# Patient Record
Sex: Female | Born: 1985 | Race: Black or African American | Hispanic: No | Marital: Married | Smoking: Current every day smoker
Health system: Southern US, Community
[De-identification: ages and names within clinical notes are randomized; demographics above are authoritative.]

## PROBLEM LIST (undated history)

## (undated) DIAGNOSIS — N809 Endometriosis, unspecified: Secondary | ICD-10-CM

## (undated) DIAGNOSIS — J45909 Unspecified asthma, uncomplicated: Secondary | ICD-10-CM

## (undated) DIAGNOSIS — D219 Benign neoplasm of connective and other soft tissue, unspecified: Secondary | ICD-10-CM

## (undated) DIAGNOSIS — D649 Anemia, unspecified: Secondary | ICD-10-CM

## (undated) DIAGNOSIS — E282 Polycystic ovarian syndrome: Secondary | ICD-10-CM

---

## 2015-02-25 ENCOUNTER — Encounter (HOSPITAL_COMMUNITY): Payer: Self-pay | Admitting: Emergency Medicine

## 2015-02-25 ENCOUNTER — Emergency Department (HOSPITAL_COMMUNITY)
Admission: EM | Admit: 2015-02-25 | Discharge: 2015-02-25 | Discharge status: Against Medical Advice | Payer: Self-pay | Attending: Emergency Medicine | Admitting: Emergency Medicine

## 2015-02-25 DIAGNOSIS — R1030 Lower abdominal pain, unspecified: Secondary | ICD-10-CM | POA: Insufficient documentation

## 2015-02-25 LAB — CBC
HEMATOCRIT: 37.4 % (ref 36.0–46.0)
HEMOGLOBIN: 12.2 g/dL (ref 12.0–15.0)
MCH: 27.7 pg (ref 26.0–34.0)
MCHC: 32.6 g/dL (ref 30.0–36.0)
MCV: 85 fL (ref 78.0–100.0)
Platelets: 328 10*3/uL (ref 150–400)
RBC: 4.4 MIL/uL (ref 3.87–5.11)
RDW: 14 % (ref 11.5–15.5)
WBC: 8.2 10*3/uL (ref 4.0–10.5)

## 2015-02-25 LAB — COMPREHENSIVE METABOLIC PANEL
ALBUMIN: 4 g/dL (ref 3.5–5.0)
ALT: 9 U/L — ABNORMAL LOW (ref 14–54)
ANION GAP: 4 — AB (ref 5–15)
AST: 16 U/L (ref 15–41)
Alkaline Phosphatase: 58 U/L (ref 38–126)
BILIRUBIN TOTAL: 0.3 mg/dL (ref 0.3–1.2)
BUN: 17 mg/dL (ref 6–20)
CO2: 30 mmol/L (ref 22–32)
Calcium: 9.3 mg/dL (ref 8.9–10.3)
Chloride: 106 mmol/L (ref 101–111)
Creatinine, Ser: 0.86 mg/dL (ref 0.44–1.00)
GLUCOSE: 93 mg/dL (ref 65–99)
POTASSIUM: 3.9 mmol/L (ref 3.5–5.1)
Sodium: 140 mmol/L (ref 135–145)
TOTAL PROTEIN: 7.6 g/dL (ref 6.5–8.1)

## 2015-02-25 LAB — HCG, QUANTITATIVE, PREGNANCY: hCG, Beta Chain, Quant, S: <1 m[IU]/mL (ref <5)

## 2015-02-25 LAB — LIPASE, BLOOD: Lipase: 33 U/L (ref 22–51)

## 2015-02-25 NOTE — ED Notes (Signed)
Pt arrived to the ED with a complaint of lower abdominal pain.  Pt states she has had this pain previously and it was stated that she had endometriosis.  Pt states pain began an hour and a half ago. Pt states pain is a stabbing pulling sensation.

## 2015-07-24 ENCOUNTER — Emergency Department (HOSPITAL_COMMUNITY): Payer: Self-pay

## 2015-07-24 ENCOUNTER — Emergency Department (HOSPITAL_COMMUNITY)
Admission: EM | Admit: 2015-07-24 | Discharge: 2015-07-24 | Disposition: A | Discharge status: Home / Self Care | Payer: Self-pay | Attending: Emergency Medicine | Admitting: Emergency Medicine

## 2015-07-24 ENCOUNTER — Encounter (HOSPITAL_COMMUNITY): Payer: Self-pay | Admitting: Emergency Medicine

## 2015-07-24 DIAGNOSIS — N760 Acute vaginitis: Secondary | ICD-10-CM | POA: Insufficient documentation

## 2015-07-24 DIAGNOSIS — Z880 Allergy status to penicillin: Secondary | ICD-10-CM | POA: Insufficient documentation

## 2015-07-24 DIAGNOSIS — J40 Bronchitis, not specified as acute or chronic: Secondary | ICD-10-CM

## 2015-07-24 DIAGNOSIS — Z862 Personal history of diseases of the blood and blood-forming organs and certain disorders involving the immune mechanism: Secondary | ICD-10-CM | POA: Insufficient documentation

## 2015-07-24 DIAGNOSIS — J45901 Unspecified asthma with (acute) exacerbation: Secondary | ICD-10-CM | POA: Insufficient documentation

## 2015-07-24 DIAGNOSIS — B9689 Other specified bacterial agents as the cause of diseases classified elsewhere: Secondary | ICD-10-CM

## 2015-07-24 DIAGNOSIS — R102 Pelvic and perineal pain: Secondary | ICD-10-CM

## 2015-07-24 DIAGNOSIS — J209 Acute bronchitis, unspecified: Secondary | ICD-10-CM | POA: Insufficient documentation

## 2015-07-24 DIAGNOSIS — D259 Leiomyoma of uterus, unspecified: Secondary | ICD-10-CM | POA: Insufficient documentation

## 2015-07-24 DIAGNOSIS — F17200 Nicotine dependence, unspecified, uncomplicated: Secondary | ICD-10-CM | POA: Insufficient documentation

## 2015-07-24 DIAGNOSIS — Z3202 Encounter for pregnancy test, result negative: Secondary | ICD-10-CM | POA: Insufficient documentation

## 2015-07-24 HISTORY — DX: Endometriosis, unspecified: N80.9

## 2015-07-24 HISTORY — DX: Anemia, unspecified: D64.9

## 2015-07-24 HISTORY — DX: Benign neoplasm of connective and other soft tissue, unspecified: D21.9

## 2015-07-24 HISTORY — DX: Unspecified asthma, uncomplicated: J45.909

## 2015-07-24 LAB — URINALYSIS, ROUTINE W REFLEX MICROSCOPIC
Bilirubin Urine: NEGATIVE
GLUCOSE, UA: NEGATIVE mg/dL
HGB URINE DIPSTICK: NEGATIVE
Ketones, ur: NEGATIVE mg/dL
LEUKOCYTES UA: NEGATIVE
Nitrite: NEGATIVE
Protein, ur: NEGATIVE mg/dL
SPECIFIC GRAVITY, URINE: 1.019 (ref 1.005–1.030)
pH: 6.5 (ref 5.0–8.0)

## 2015-07-24 LAB — COMPREHENSIVE METABOLIC PANEL
ALBUMIN: 4.1 g/dL (ref 3.5–5.0)
ALT: 9 U/L — ABNORMAL LOW (ref 14–54)
AST: 17 U/L (ref 15–41)
Alkaline Phosphatase: 52 U/L (ref 38–126)
Anion gap: 10 (ref 5–15)
BUN: 10 mg/dL (ref 6–20)
CHLORIDE: 109 mmol/L (ref 101–111)
CO2: 22 mmol/L (ref 22–32)
Calcium: 9.1 mg/dL (ref 8.9–10.3)
Creatinine, Ser: 0.82 mg/dL (ref 0.44–1.00)
GFR calc Af Amer: >60 mL/min (ref >60)
GFR calc non Af Amer: >60 mL/min (ref >60)
GLUCOSE: 82 mg/dL (ref 65–99)
POTASSIUM: 3.6 mmol/L (ref 3.5–5.1)
SODIUM: 141 mmol/L (ref 135–145)
Total Bilirubin: 0.4 mg/dL (ref 0.3–1.2)
Total Protein: 7.6 g/dL (ref 6.5–8.1)

## 2015-07-24 LAB — CBC WITH DIFFERENTIAL/PLATELET
BASOS ABS: 0 10*3/uL (ref 0.0–0.1)
BASOS PCT: 0 %
EOS ABS: 0.4 10*3/uL (ref 0.0–0.7)
Eosinophils Relative: 4 %
HEMATOCRIT: 35.7 % — AB (ref 36.0–46.0)
Hemoglobin: 11.5 g/dL — ABNORMAL LOW (ref 12.0–15.0)
Lymphocytes Relative: 24 %
Lymphs Abs: 2.3 10*3/uL (ref 0.7–4.0)
MCH: 27.1 pg (ref 26.0–34.0)
MCHC: 32.2 g/dL (ref 30.0–36.0)
MCV: 84.2 fL (ref 78.0–100.0)
MONO ABS: 0.7 10*3/uL (ref 0.1–1.0)
Monocytes Relative: 7 %
NEUTROS ABS: 6.2 10*3/uL (ref 1.7–7.7)
Neutrophils Relative %: 65 %
PLATELETS: 309 10*3/uL (ref 150–400)
RBC: 4.24 MIL/uL (ref 3.87–5.11)
RDW: 14 % (ref 11.5–15.5)
WBC: 9.6 10*3/uL (ref 4.0–10.5)

## 2015-07-24 LAB — I-STAT BETA HCG BLOOD, ED (MC, WL, AP ONLY): I-stat hCG, quantitative: <5 m[IU]/mL (ref <5)

## 2015-07-24 LAB — WET PREP, GENITAL
SPERM: NONE SEEN
TRICH WET PREP: NONE SEEN
YEAST WET PREP: NONE SEEN

## 2015-07-24 MED ORDER — KETOROLAC TROMETHAMINE 30 MG/ML IJ SOLN
30.0000 mg | Freq: Once | INTRAMUSCULAR | Status: AC
  Administered 2015-07-24: 30 mg via INTRAVENOUS
  Filled 2015-07-24: qty 1

## 2015-07-24 MED ORDER — DEXAMETHASONE SODIUM PHOSPHATE 10 MG/ML IJ SOLN
10.0000 mg | Freq: Once | INTRAMUSCULAR | Status: AC
  Administered 2015-07-24: 10 mg via INTRAVENOUS
  Filled 2015-07-24: qty 1

## 2015-07-24 MED ORDER — DEXTROSE 5 % IV SOLN
1.0000 g | Freq: Once | INTRAVENOUS | Status: AC
  Administered 2015-07-24: 1 g via INTRAVENOUS
  Filled 2015-07-24: qty 10

## 2015-07-24 MED ORDER — ALBUTEROL SULFATE (2.5 MG/3ML) 0.083% IN NEBU
5.0000 mg | INHALATION_SOLUTION | Freq: Once | RESPIRATORY_TRACT | Status: AC
  Administered 2015-07-24: 5 mg via RESPIRATORY_TRACT
  Filled 2015-07-24: qty 6

## 2015-07-24 MED ORDER — METRONIDAZOLE 500 MG PO TABS: 500.0000 mg | ORAL_TABLET | Freq: Two times a day (BID) | ORAL | Status: AC

## 2015-07-24 MED ORDER — MORPHINE SULFATE (PF) 2 MG/ML IV SOLN
2.0000 mg | Freq: Once | INTRAVENOUS | Status: AC
Start: 2015-07-24 — End: 2015-07-24
  Administered 2015-07-24: 2 mg via INTRAVENOUS
  Filled 2015-07-24: qty 1

## 2015-07-24 MED ORDER — DEXAMETHASONE 2 MG PO TABS: 10.0000 mg | ORAL_TABLET | Freq: Once | ORAL | Status: AC

## 2015-07-24 MED ORDER — IPRATROPIUM BROMIDE 0.02 % IN SOLN
0.5000 mg | Freq: Once | RESPIRATORY_TRACT | Status: AC
  Administered 2015-07-24: 0.5 mg via RESPIRATORY_TRACT
  Filled 2015-07-24: qty 2.5

## 2015-07-24 MED ORDER — IBUPROFEN 600 MG PO TABS: 600.0000 mg | ORAL_TABLET | Freq: Four times a day (QID) | ORAL | Status: AC | PRN

## 2015-07-24 MED ORDER — IPRATROPIUM-ALBUTEROL 0.5-2.5 (3) MG/3ML IN SOLN
3.0000 mL | Freq: Once | RESPIRATORY_TRACT | Status: AC
  Administered 2015-07-24: 3 mL via RESPIRATORY_TRACT
  Filled 2015-07-24: qty 3

## 2015-07-24 MED ORDER — ALBUTEROL SULFATE HFA 108 (90 BASE) MCG/ACT IN AERS
1.0000 | INHALATION_SPRAY | RESPIRATORY_TRACT | Status: DC | PRN
  Administered 2015-07-24: 2 via RESPIRATORY_TRACT
  Filled 2015-07-24: qty 6.7

## 2015-07-24 MED ORDER — METRONIDAZOLE 500 MG PO TABS
500.0000 mg | ORAL_TABLET | Freq: Once | ORAL | Status: AC
  Administered 2015-07-24: 500 mg via ORAL
  Filled 2015-07-24: qty 1

## 2015-07-24 MED ORDER — AZITHROMYCIN 250 MG PO TABS
1000.0000 mg | ORAL_TABLET | Freq: Once | ORAL | Status: AC
  Administered 2015-07-24: 1000 mg via ORAL
  Filled 2015-07-24: qty 4

## 2015-07-24 NOTE — ED Notes (Signed)
U/S AT THE BEDSIDE.

## 2015-07-24 NOTE — ED Notes (Signed)
MD at bedside. 

## 2015-07-24 NOTE — Discharge Instructions (Signed)
You were seen today for your pelvic pain and shortness of breath with coughing.  Your pelvic pain is likely secondary to a bacterial infection in your vagina and a fibroid in your uterus.  Take the medications as prescribed to cure the infection and to help control your pain.  Follow up at the women's center for continued treatment and management.  Use the inhaler to help with your breathing.  Take the second dose of steroids as prescribed.  Follow up at the wellness clinic to establish care and for continued management of what may be recurrence of your asthma or bronchitis.  It is important that you stop smoking to help allow your lungs to heal.  Acute Bronchitis Bronchitis is inflammation of the airways that extend from the windpipe into the lungs (bronchi). The inflammation often causes mucus to develop. This leads to a cough, which is the most common symptom of bronchitis.  In acute bronchitis, the condition usually develops suddenly and goes away over time, usually in a couple weeks. Smoking, allergies, and asthma can make bronchitis worse. Repeated episodes of bronchitis may cause further lung problems.  CAUSES Acute bronchitis is most often caused by the same virus that causes a cold. The virus can spread from person to person (contagious) through coughing, sneezing, and touching contaminated objects. SIGNS AND SYMPTOMS   Cough.   Fever.   Coughing up mucus.   Body aches.   Chest congestion.   Chills.   Shortness of breath.   Sore throat.  DIAGNOSIS  Acute bronchitis is usually diagnosed through a physical exam. Your health care provider will also ask you questions about your medical history. Tests, such as chest X-rays, are sometimes done to rule out other conditions.  TREATMENT  Acute bronchitis usually goes away in a couple weeks. Oftentimes, no medical treatment is necessary. Medicines are sometimes given for relief of fever or cough. Antibiotic medicines are usually not  needed but may be prescribed in certain situations. In some cases, an inhaler may be recommended to help reduce shortness of breath and control the cough. A cool mist vaporizer may also be used to help thin bronchial secretions and make it easier to clear the chest.  HOME CARE INSTRUCTIONS  Get plenty of rest.   Drink enough fluids to keep your urine clear or pale yellow (unless you have a medical condition that requires fluid restriction). Increasing fluids may help thin your respiratory secretions (sputum) and reduce chest congestion, and it will prevent dehydration.   Take medicines only as directed by your health care provider.  If you were prescribed an antibiotic medicine, finish it all even if you start to feel better.  Avoid smoking and secondhand smoke. Exposure to cigarette smoke or irritating chemicals will make bronchitis worse. If you are a smoker, consider using nicotine gum or skin patches to help control withdrawal symptoms. Quitting smoking will help your lungs heal faster.   Reduce the chances of another bout of acute bronchitis by washing your hands frequently, avoiding people with cold symptoms, and trying not to touch your hands to your mouth, nose, or eyes.   Keep all follow-up visits as directed by your health care provider.  SEEK MEDICAL CARE IF: Your symptoms do not improve after 1 week of treatment.  SEEK IMMEDIATE MEDICAL CARE IF:  You develop an increased fever or chills.   You have chest pain.   You have severe shortness of breath.  You have bloody sputum.   You develop dehydration.  You faint or repeatedly feel like you are going to pass out.  You develop repeated vomiting.  You develop a severe headache. MAKE SURE YOU:   Understand these instructions.  Will watch your condition.  Will get help right away if you are not doing well or get worse.   This information is not intended to replace advice given to you by your health care  provider. Make sure you discuss any questions you have with your health care provider.   Document Released: 08/05/2004 Document Revised: 07/19/2014 Document Reviewed: 12/19/2012 Elsevier Interactive Patient Education 2016 Elsevier Inc.  Bacterial Vaginosis Bacterial vaginosis is a vaginal infection that occurs when the normal balance of bacteria in the vagina is disrupted. It results from an overgrowth of certain bacteria. This is the most common vaginal infection in women of childbearing age. Treatment is important to prevent complications, especially in pregnant women, as it can cause a premature delivery. CAUSES  Bacterial vaginosis is caused by an increase in harmful bacteria that are normally present in smaller amounts in the vagina. Several different kinds of bacteria can cause bacterial vaginosis. However, the reason that the condition develops is not fully understood. RISK FACTORS Certain activities or behaviors can put you at an increased risk of developing bacterial vaginosis, including:  Having a new sex partner or multiple sex partners.  Douching.  Using an intrauterine device (IUD) for contraception. Women do not get bacterial vaginosis from toilet seats, bedding, swimming pools, or contact with objects around them. SIGNS AND SYMPTOMS  Some women with bacterial vaginosis have no signs or symptoms. Common symptoms include:  Grey vaginal discharge.  A fishlike odor with discharge, especially after sexual intercourse.  Itching or burning of the vagina and vulva.  Burning or pain with urination. DIAGNOSIS  Your health care provider will take a medical history and examine the vagina for signs of bacterial vaginosis. A sample of vaginal fluid may be taken. Your health care provider will look at this sample under a microscope to check for bacteria and abnormal cells. A vaginal pH test may also be done.  TREATMENT  Bacterial vaginosis may be treated with antibiotic medicines.  These may be given in the form of a pill or a vaginal cream. A second round of antibiotics may be prescribed if the condition comes back after treatment. Because bacterial vaginosis increases your risk for sexually transmitted diseases, getting treated can help reduce your risk for chlamydia, gonorrhea, HIV, and herpes. HOME CARE INSTRUCTIONS   Only take over-the-counter or prescription medicines as directed by your health care provider.  If antibiotic medicine was prescribed, take it as directed. Make sure you finish it even if you start to feel better.  Tell all sexual partners that you have a vaginal infection. They should see their health care provider and be treated if they have problems, such as a mild rash or itching.  During treatment, it is important that you follow these instructions:  Avoid sexual activity or use condoms correctly.  Do not douche.  Avoid alcohol as directed by your health care provider.  Avoid breastfeeding as directed by your health care provider. SEEK MEDICAL CARE IF:   Your symptoms are not improving after 3 days of treatment.  You have increased discharge or pain.  You have a fever. MAKE SURE YOU:   Understand these instructions.  Will watch your condition.  Will get help right away if you are not doing well or get worse. FOR MORE INFORMATION  Centers for  Disease Control and Prevention, Division of STD Prevention: [employer] American Sexual Health Association (ASHA): www.ashastd.org    This information is not intended to replace advice given to you by your health care provider. Make sure you discuss any questions you have with your health care provider.   Document Released: 06/28/2005 Document Revised: 07/19/2014 Document Reviewed: 02/07/2013 Elsevier Interactive Patient Education 2016 Elsevier Inc.  Uterine Fibroids Uterine fibroids are tissue masses (tumors) that can develop in the womb (uterus). They are also called leiomyomas. This  type of tumor is not cancerous (benign) and does not spread to other parts of the body outside of the pelvic area, which is between the hip bones. Occasionally, fibroids may develop in the fallopian tubes, in the cervix, or on the support structures (ligaments) that surround the uterus. You can have one or many fibroids. Fibroids can vary in size, weight, and where they grow in the uterus. Some can become quite large. Most fibroids do not require medical treatment. CAUSES A fibroid can develop when a single uterine cell keeps growing (replicating). Most cells in the human body have a control mechanism that keeps them from replicating without control. SIGNS AND SYMPTOMS Symptoms may include:   Heavy bleeding during your period.  Bleeding or spotting between periods.  Pelvic pain and pressure.  Bladder problems, such as needing to urinate more often (urinary frequency) or urgently.  Inability to reproduce offspring (infertility).  Miscarriages. DIAGNOSIS Uterine fibroids are diagnosed through a physical exam. Your health care provider may feel the lumpy tumors during a pelvic exam. Ultrasonography and an MRI may be done to determine the size, location, and number of fibroids. TREATMENT Treatment may include:  Watchful waiting. This involves getting the fibroid checked by your health care provider to see if it grows or shrinks. Follow your health care provider's recommendations for how often to have this checked.  Hormone medicines. These can be taken by mouth or given through an intrauterine device (IUD).  Surgery.  Removing the fibroids (myomectomy) or the uterus (hysterectomy).  Removing blood supply to the fibroids (uterine artery embolization). If fibroids interfere with your fertility and you want to become pregnant, your health care provider may recommend having the fibroids removed.  HOME CARE INSTRUCTIONS  Keep all follow-up visits as directed by your health care provider.  This is important.  Take medicines only as directed by your health care provider.  If you were prescribed a hormone treatment, take the hormone medicines exactly as directed.  Do not take aspirin, because it can cause bleeding.  Ask your health care provider about taking iron pills and increasing the amount of dark green, leafy vegetables in your diet. These actions can help to boost your blood iron levels, which may be affected by heavy menstrual bleeding.  Pay close attention to your period and tell your health care provider about any changes, such as:  Increased blood flow that requires you to use more pads or tampons than usual per month.  A change in the number of days that your period lasts per month.  A change in symptoms that are associated with your period, such as abdominal cramping or back pain. SEEK MEDICAL CARE IF:  You have pelvic pain, back pain, or abdominal cramps that cannot be controlled with medicines.  You have an increase in bleeding between and during periods.  You soak tampons or pads in a half hour or less.  You feel lightheaded, extra tired, or weak. SEEK IMMEDIATE MEDICAL CARE IF:  You faint.  You have a sudden increase in pelvic pain.   This information is not intended to replace advice given to you by your health care provider. Make sure you discuss any questions you have with your health care provider.   Document Released: 06/25/2000 Document Revised: 07/19/2014 Document Reviewed: 12/25/2013 Elsevier Interactive Patient Education [employer].

## 2015-07-24 NOTE — ED Notes (Signed)
Pt states she has had a cough and chest congestion with tightness x 4 days  Pt states she took dayquil the first day and then yesterday took some ibuprofen and sudafed once  Pt states she has chronic pelvic pain for the past 3 years but has been worse for the past week  Pt states she has history of ovarian cysts, endometriosis, and fibroids

## 2015-07-24 NOTE — ED Provider Notes (Signed)
CSN: [id number]     Arrival date & time 07/24/15  [id number] History   First MD Initiated Contact with Patient 07/24/15 1606     Chief Complaint  Patient presents with  . Cough  . Pelvic Pain     (Consider location/radiation/quality/duration/timing/severity/associated sxs/prior Treatment) HPI Comments: 30 y.o. Female with history of asthma as a child/teenager, ovarian cysts, fibroids, endometriosis presents for pelvic pain as well as cough and shortness of breath.  The patient recently moved up here to live with her sister after a divorce.  She was previously living in [place].  She reports the stress of the situation has made her start smoking again and that she has noted wheezing and coughing over the last several days.  She says it is worse at night and that she wakes up feeling like she is unable to breathe.  Denies fever or chills.  No known sick contacts.  Says this feels like her asthma but that she has not required treatment since she was about 30 years old.  She also reports that she has had worsening pelvic pain from her chronic pain and that the pain is mostly on the left side.  She says that she was once told her ovary may have twisted on itself by an OBGYN in [place] but that they ended up checking it with imaging and that it was not twisted.  Denies vaginal discharge or bleeding.  She reports that the pain in her left pelvis is at times severe to the point where she feels she might pass out.  Patient is a 30 y.o. female presenting with cough and pelvic pain.  Cough Associated symptoms: shortness of breath and wheezing   Associated symptoms: no chest pain, no chills, no fever, no headaches, no myalgias, no rash, no rhinorrhea and no sore throat   Pelvic Pain Associated symptoms include shortness of breath. Pertinent negatives include no chest pain, no abdominal pain and no headaches.    Past Medical History  Diagnosis Date  . Asthma   . Anemia   . Endometriosis   . Fibroids    Past  Surgical History  Procedure Laterality Date  . Cesarean section  2013  . Cesarean section     Family History  Problem Relation Age of Onset  . Diabetes Other   . CAD Other    Social History  Substance Use Topics  . Smoking status: Current Every Day Smoker  . Smokeless tobacco: None  . Alcohol Use: Yes   OB History    No data available     Review of Systems  Constitutional: Negative for fever, chills and fatigue.  HENT: Negative for congestion, postnasal drip, rhinorrhea, sinus pressure, sore throat and voice change.   Eyes: Negative for visual disturbance.  Respiratory: Positive for cough, shortness of breath and wheezing. Negative for chest tightness.   Cardiovascular: Negative for chest pain, palpitations and leg swelling.  Gastrointestinal: Negative for nausea, vomiting, abdominal pain, diarrhea and constipation.  Genitourinary: Positive for pelvic pain. Negative for dysuria, urgency, flank pain and vaginal pain.  Musculoskeletal: Negative for myalgias and back pain.  Skin: Negative for rash.  Neurological: Negative for dizziness, weakness and headaches.  Hematological: Does not bruise/bleed easily.      Allergies  Penicillins and Tylenol  Home Medications   Prior to Admission medications   Medication Sig Start Date End Date Taking? Authorizing Provider  pseudoephedrine (SUDAFED) 60 MG tablet Take 60 mg by mouth every 4 (four) hours as needed for  congestion.   Yes Historical Provider, MD  dexamethasone (DECADRON) 2 MG tablet Take 5 tablets (10 mg total) by mouth once. Take as a single dose on 07/25/14 in the morning after breakfast 07/24/15   Harvel Quale, MD  ibuprofen (ADVIL,MOTRIN) 600 MG tablet Take 1 tablet (600 mg total) by mouth every 6 (six) hours as needed. 07/24/15   Harvel Quale, MD  metroNIDAZOLE (FLAGYL) 500 MG tablet Take 1 tablet (500 mg total) by mouth 2 (two) times daily. 07/24/15   Harvel Quale, MD   BP 120/72 mmHg  Pulse 94  Temp(Src) 98  F (36.7 C) (Oral)  Resp 18  Ht 4\' 11"  (1.499 m)  Wt 135 lb (61.236 kg)  BMI 27.25 kg/m2  SpO2 100%  LMP 05/26/2015 (Exact Date) Physical Exam  Constitutional: She is oriented to person, place, and time. She appears well-developed and well-nourished. No distress.  HENT:  Head: Normocephalic and atraumatic.  Right Ear: External ear normal.  Left Ear: External ear normal.  Nose: Nose normal.  Mouth/Throat: Oropharynx is clear and moist. No oropharyngeal exudate.  Eyes: EOM are normal. Pupils are equal, round, and reactive to light.  Neck: Normal range of motion. Neck supple.  Cardiovascular: Normal rate, regular rhythm, normal heart sounds and intact distal pulses.   No murmur heard. Pulmonary/Chest: Effort normal. No respiratory distress. She has wheezes (bilateral, diffuse). She has no rales.  Abdominal: Soft. She exhibits no distension. There is tenderness (tenderness in the left pelvis) in the left lower quadrant. There is no rebound and no CVA tenderness.  Musculoskeletal: Normal range of motion. She exhibits no edema or tenderness.  Neurological: She is alert and oriented to person, place, and time.  Skin: Skin is warm and dry. No rash noted. She is not diaphoretic.  Vitals reviewed.   ED Course  Procedures (including critical care time) Labs Review Labs Reviewed  WET PREP, GENITAL - Abnormal; Notable for the following:    Clue Cells Wet Prep HPF POC PRESENT (*)    WBC, Wet Prep HPF POC MODERATE (*)    All other components within normal limits  CBC WITH DIFFERENTIAL/PLATELET - Abnormal; Notable for the following:    Hemoglobin 11.5 (*)    HCT 35.7 (*)    All other components within normal limits  COMPREHENSIVE METABOLIC PANEL - Abnormal; Notable for the following:    ALT 9 (*)    All other components within normal limits  URINALYSIS, ROUTINE W REFLEX MICROSCOPIC (NOT AT [facility]) - Abnormal; Notable for the following:    APPearance CLOUDY (*)    All other components  within normal limits  RPR  HIV ANTIBODY (ROUTINE TESTING)  I-STAT BETA HCG BLOOD, ED ([facility], [facility], [facility] ONLY)  GC/CHLAMYDIA PROBE AMP ([place]) NOT AT [facility]    Imaging Review Dg Chest 2 View  07/24/2015  CLINICAL DATA:  Cough and congestion for 4 days EXAM: CHEST - 2 VIEW COMPARISON:  None. FINDINGS: The heart size and mediastinal contours are within normal limits. Both lungs are clear. The visualized skeletal structures are unremarkable. IMPRESSION: No active disease. Electronically Signed   By: Inez Catalina M.D.   On: 07/24/2015 16:42   US Transvaginal Non-ob  07/24/2015  CLINICAL DATA:  Acute bilateral pelvic pain. EXAM: TRANSABDOMINAL AND TRANSVAGINAL ULTRASOUND OF PELVIS TECHNIQUE: Both transabdominal and transvaginal ultrasound examinations of the pelvis were performed. Transabdominal technique was performed for global imaging of the pelvis including uterus, ovaries, adnexal regions, and pelvic cul-de-sac. It was  necessary to proceed with endovaginal exam following the transabdominal exam to visualize the ovaries. COMPARISON:  None FINDINGS: Uterus Measurements: 8.7 x 7.8 x 6.4 cm. Fibroid measuring 4.8 x 5.5 x 5.2 cm is noted on right side. Endometrium Thickness: 8 mm which is within normal limits. No focal abnormality visualized. Right ovary Measurements: 4.2 x 3.1 x 2.3 cm. Multiple follicular cysts are noted, with the largest measuring 2.1 cm. Left ovary Measurements: 4.1 x 3.1 x 1.7 cm. Normal appearance/no adnexal mass. Other findings No abnormal free fluid. IMPRESSION: 5.5 cm uterine fibroid is noted. No other significant abnormality seen in the pelvis. Electronically Signed   By: Marijo Conception, M.D.   On: 07/24/2015 19:04   US Pelvis Complete  07/24/2015  CLINICAL DATA:  Acute bilateral pelvic pain. EXAM: TRANSABDOMINAL AND TRANSVAGINAL ULTRASOUND OF PELVIS TECHNIQUE: Both transabdominal and transvaginal ultrasound examinations of the pelvis were performed. Transabdominal technique was  performed for global imaging of the pelvis including uterus, ovaries, adnexal regions, and pelvic cul-de-sac. It was necessary to proceed with endovaginal exam following the transabdominal exam to visualize the ovaries. COMPARISON:  None FINDINGS: Uterus Measurements: 8.7 x 7.8 x 6.4 cm. Fibroid measuring 4.8 x 5.5 x 5.2 cm is noted on right side. Endometrium Thickness: 8 mm which is within normal limits. No focal abnormality visualized. Right ovary Measurements: 4.2 x 3.1 x 2.3 cm. Multiple follicular cysts are noted, with the largest measuring 2.1 cm. Left ovary Measurements: 4.1 x 3.1 x 1.7 cm. Normal appearance/no adnexal mass. Other findings No abnormal free fluid. IMPRESSION: 5.5 cm uterine fibroid is noted. No other significant abnormality seen in the pelvis. Electronically Signed   By: Marijo Conception, M.D.   On: 07/24/2015 19:04   I have personally reviewed and evaluated these images and lab results as part of my medical decision-making.   EKG Interpretation None      MDM  Patient was seen and evaluated in stable condition.  Patient was given Decadron and breathing treatment for her wheezing with improvement.  Chest xray negative for acute process.  Pelvic examination with mild discharge.  Wet prep with clue cells consistent with BV.  Pelvic US with fibroid uterus as well as multiple follicular cysts on the right kidney which is not the side of her pain - no sign of torsion.  Patient was treated empirically for STD with Rocephin and Azithromycin.  She was also discharged with prescription for flagyl.  She was also provided an albuterol inhaler and a prescription for a second dose of decadron.  She was instructed to follow up at the women's center and with the wellness clinic for establishment of care and reevaluation.  Patient was counseled on the need to quit smoking. Final diagnoses:  Bacterial vaginosis  Bronchitis  Uterine leiomyoma, unspecified location    1. Bacterial vaginosis  2.  Bronchitis  3. Uterine fibroid    Harvel Quale, MD 07/25/15 [phone]

## 2015-07-25 LAB — GC/CHLAMYDIA PROBE AMP (~~LOC~~) NOT AT ARMC
CHLAMYDIA, DNA PROBE: NEGATIVE
NEISSERIA GONORRHEA: NEGATIVE

## 2015-07-25 LAB — RPR: RPR Ser Ql: NONREACTIVE

## 2015-07-25 LAB — HIV ANTIBODY (ROUTINE TESTING W REFLEX): HIV SCREEN 4TH GENERATION: NONREACTIVE

## 2015-11-22 ENCOUNTER — Encounter (HOSPITAL_COMMUNITY): Payer: Self-pay | Admitting: Emergency Medicine

## 2015-11-22 ENCOUNTER — Telehealth (HOSPITAL_BASED_OUTPATIENT_CLINIC_OR_DEPARTMENT_OTHER): Payer: Self-pay

## 2015-11-22 ENCOUNTER — Emergency Department (HOSPITAL_COMMUNITY): Payer: Medicaid Other

## 2015-11-22 ENCOUNTER — Emergency Department (HOSPITAL_COMMUNITY)
Admission: EM | Admit: 2015-11-22 | Discharge: 2015-11-22 | Disposition: A | Discharge status: Home / Self Care | Payer: Medicaid Other | Attending: Emergency Medicine | Admitting: Emergency Medicine

## 2015-11-22 DIAGNOSIS — Z7952 Long term (current) use of systemic steroids: Secondary | ICD-10-CM | POA: Insufficient documentation

## 2015-11-22 DIAGNOSIS — Z79899 Other long term (current) drug therapy: Secondary | ICD-10-CM | POA: Diagnosis not present

## 2015-11-22 DIAGNOSIS — Z331 Pregnant state, incidental: Secondary | ICD-10-CM

## 2015-11-22 DIAGNOSIS — F17200 Nicotine dependence, unspecified, uncomplicated: Secondary | ICD-10-CM | POA: Diagnosis not present

## 2015-11-22 DIAGNOSIS — Z3490 Encounter for supervision of normal pregnancy, unspecified, unspecified trimester: Secondary | ICD-10-CM

## 2015-11-22 DIAGNOSIS — Z3A11 11 weeks gestation of pregnancy: Secondary | ICD-10-CM | POA: Diagnosis not present

## 2015-11-22 DIAGNOSIS — O3411 Maternal care for benign tumor of corpus uteri, first trimester: Secondary | ICD-10-CM | POA: Insufficient documentation

## 2015-11-22 DIAGNOSIS — D259 Leiomyoma of uterus, unspecified: Secondary | ICD-10-CM | POA: Insufficient documentation

## 2015-11-22 DIAGNOSIS — O26891 Other specified pregnancy related conditions, first trimester: Secondary | ICD-10-CM | POA: Diagnosis present

## 2015-11-22 DIAGNOSIS — O99331 Smoking (tobacco) complicating pregnancy, first trimester: Secondary | ICD-10-CM | POA: Diagnosis not present

## 2015-11-22 DIAGNOSIS — Z792 Long term (current) use of antibiotics: Secondary | ICD-10-CM | POA: Insufficient documentation

## 2015-11-22 DIAGNOSIS — Z791 Long term (current) use of non-steroidal anti-inflammatories (NSAID): Secondary | ICD-10-CM | POA: Diagnosis not present

## 2015-11-22 DIAGNOSIS — J45909 Unspecified asthma, uncomplicated: Secondary | ICD-10-CM | POA: Diagnosis not present

## 2015-11-22 HISTORY — DX: Polycystic ovarian syndrome: E28.2

## 2015-11-22 LAB — URINALYSIS, ROUTINE W REFLEX MICROSCOPIC
BILIRUBIN URINE: NEGATIVE
Glucose, UA: NEGATIVE mg/dL
Hgb urine dipstick: NEGATIVE
Ketones, ur: >80 mg/dL — AB
Leukocytes, UA: NEGATIVE
NITRITE: NEGATIVE
Protein, ur: NEGATIVE mg/dL
SPECIFIC GRAVITY, URINE: 1.03 (ref 1.005–1.030)
pH: 6 (ref 5.0–8.0)

## 2015-11-22 LAB — COMPREHENSIVE METABOLIC PANEL
ALT: 12 U/L — ABNORMAL LOW (ref 14–54)
ANION GAP: 9 (ref 5–15)
AST: 17 U/L (ref 15–41)
Albumin: 4 g/dL (ref 3.5–5.0)
Alkaline Phosphatase: 32 U/L — ABNORMAL LOW (ref 38–126)
BUN: 10 mg/dL (ref 6–20)
CHLORIDE: 107 mmol/L (ref 101–111)
CO2: 21 mmol/L — ABNORMAL LOW (ref 22–32)
Calcium: 9.5 mg/dL (ref 8.9–10.3)
Creatinine, Ser: 0.58 mg/dL (ref 0.44–1.00)
Glucose, Bld: 73 mg/dL (ref 65–99)
POTASSIUM: 4 mmol/L (ref 3.5–5.1)
Sodium: 137 mmol/L (ref 135–145)
Total Bilirubin: 0.5 mg/dL (ref 0.3–1.2)
Total Protein: 7.3 g/dL (ref 6.5–8.1)

## 2015-11-22 LAB — CBC WITH DIFFERENTIAL/PLATELET
BASOS ABS: 0 10*3/uL (ref 0.0–0.1)
Basophils Relative: 0 %
EOS PCT: 1 %
Eosinophils Absolute: 0 10*3/uL (ref 0.0–0.7)
HCT: 35.5 % — ABNORMAL LOW (ref 36.0–46.0)
Hemoglobin: 12 g/dL (ref 12.0–15.0)
LYMPHS PCT: 23 %
Lymphs Abs: 2 10*3/uL (ref 0.7–4.0)
MCH: 28.5 pg (ref 26.0–34.0)
MCHC: 33.8 g/dL (ref 30.0–36.0)
MCV: 84.3 fL (ref 78.0–100.0)
Monocytes Absolute: 0.4 10*3/uL (ref 0.1–1.0)
Monocytes Relative: 5 %
Neutro Abs: 6 10*3/uL (ref 1.7–7.7)
Neutrophils Relative %: 71 %
Platelets: 287 10*3/uL (ref 150–400)
RBC: 4.21 MIL/uL (ref 3.87–5.11)
RDW: 15.1 % (ref 11.5–15.5)
WBC: 8.4 10*3/uL (ref 4.0–10.5)

## 2015-11-22 LAB — HCG, QUANTITATIVE, PREGNANCY: HCG, BETA CHAIN, QUANT, S: 123515 m[IU]/mL — AB (ref <5)

## 2015-11-22 NOTE — ED Notes (Signed)
Pt reports lower abd pain in vaginal area for past 2 days accompanied by dizziness and weakness. Pt is [redacted] weeks pregnant and having spotting.  Also has had emesis everyday for the past month.

## 2015-11-22 NOTE — ED Notes (Signed)
Pt requesting to speak to EDP before discharge. Pt given sandwich and and juice. EDP Zammit made aware

## 2015-11-22 NOTE — ED Notes (Signed)
Patient transported to Ultrasound 

## 2015-11-22 NOTE — Discharge Instructions (Signed)
Follow-up at the [facility] women's clinic this week take Tylenol for pain

## 2015-11-22 NOTE — ED Provider Notes (Signed)
CSN: [id number]     Arrival date & time 11/22/15  1037 History   First MD Initiated Contact with Patient 11/22/15 1130     Chief Complaint  Patient presents with  . Abdominal Pain     (Consider location/radiation/quality/duration/timing/severity/associated sxs/prior Treatment) Patient is a 30 y.o. female presenting with abdominal pain. The history is provided by the patient (Patient states she is [redacted] weeks pregnant she's been having some lower abdominal discomfort with little bit of spotting. She states she has a fibroid.).  Abdominal Pain Pain location:  Suprapubic Pain quality: aching   Pain radiates to:  Does not radiate Pain severity:  Mild Onset quality:  Sudden Timing:  Intermittent Progression:  Partially resolved Chronicity:  Recurrent Context: alcohol use   Associated symptoms: no chest pain, no cough, no diarrhea, no fatigue and no hematuria     Past Medical History  Diagnosis Date  . Asthma   . Anemia   . Endometriosis   . Fibroids   . PCOS (polycystic ovarian syndrome)    Past Surgical History  Procedure Laterality Date  . Cesarean section  2013  . Cesarean section     Family History  Problem Relation Age of Onset  . Diabetes Other   . CAD Other    Social History  Substance Use Topics  . Smoking status: Current Every Day Smoker  . Smokeless tobacco: None  . Alcohol Use: Yes   OB History    No data available     Review of Systems  Constitutional: Negative for appetite change and fatigue.  HENT: Negative for congestion, ear discharge and sinus pressure.   Eyes: Negative for discharge.  Respiratory: Negative for cough.   Cardiovascular: Negative for chest pain.  Gastrointestinal: Positive for abdominal pain. Negative for diarrhea.  Genitourinary: Negative for frequency and hematuria.  Musculoskeletal: Negative for back pain.  Skin: Negative for rash.  Neurological: Negative for seizures and headaches.  Psychiatric/Behavioral: Negative for  hallucinations.      Allergies  Penicillins and Tylenol  Home Medications   Prior to Admission medications   Medication Sig Start Date End Date Taking? Authorizing Provider  Prenatal Vit-Fe Fumarate-FA (PRENATAL MULTIVITAMIN) TABS tablet Take 1 tablet by mouth daily at 12 noon.   Yes Historical Provider, MD  dexamethasone (DECADRON) 2 MG tablet Take 5 tablets (10 mg total) by mouth once. Take as a single dose on 07/25/14 in the morning after breakfast Patient not taking: Reported on 11/22/2015 07/24/15   Harvel Quale, MD  ibuprofen (ADVIL,MOTRIN) 600 MG tablet Take 1 tablet (600 mg total) by mouth every 6 (six) hours as needed. Patient not taking: Reported on 11/22/2015 07/24/15   Harvel Quale, MD  metroNIDAZOLE (FLAGYL) 500 MG tablet Take 1 tablet (500 mg total) by mouth 2 (two) times daily. Patient not taking: Reported on 11/22/2015 07/24/15   Harvel Quale, MD   BP 105/63 mmHg  Pulse 59  Temp(Src) 98.7 F (37.1 C) (Oral)  Resp 18  Ht 4\' 11"  (1.499 m)  Wt 134 lb (60.782 kg)  BMI 27.05 kg/m2  SpO2 100% Physical Exam  Constitutional: She is oriented to person, place, and time. She appears well-developed.  HENT:  Head: Normocephalic.  Eyes: Conjunctivae and EOM are normal. No scleral icterus.  Neck: Neck supple. No thyromegaly present.  Cardiovascular: Normal rate and regular rhythm.  Exam reveals no gallop and no friction rub.   No murmur heard. Pulmonary/Chest: No stridor. She has no wheezes. She has no rales.  She exhibits no tenderness.  Abdominal: She exhibits no distension. There is tenderness. There is no rebound.  Patient's abdomen is consistent with a [redacted] week pregnant woman. Patient have been minor tenderness over her uterus.  Musculoskeletal: Normal range of motion. She exhibits no edema.  Lymphadenopathy:    She has no cervical adenopathy.  Neurological: She is oriented to person, place, and time. She exhibits normal muscle tone. Coordination normal.  Skin: No  rash noted. No erythema.  Psychiatric: She has a normal mood and affect. Her behavior is normal.    ED Course  Procedures (including critical care time) Labs Review Labs Reviewed  CBC WITH DIFFERENTIAL/PLATELET - Abnormal; Notable for the following:    HCT 35.5 (*)    All other components within normal limits  COMPREHENSIVE METABOLIC PANEL - Abnormal; Notable for the following:    CO2 21 (*)    ALT 12 (*)    Alkaline Phosphatase 32 (*)    All other components within normal limits  URINALYSIS, ROUTINE W REFLEX MICROSCOPIC (NOT AT [facility]) - Abnormal; Notable for the following:    Ketones, ur >80 (*)    All other components within normal limits  HCG, QUANTITATIVE, PREGNANCY - Abnormal; Notable for the following:    hCG, Beta Chain, [place], [place] [id number] (*)    All other components within normal limits    Imaging Review US Ob Comp Less 14 Wks  11/22/2015  CLINICAL DATA:  Abdominal pain. EXAM: OBSTETRIC <14 WK [place] AND TRANSVAGINAL OB US TECHNIQUE: Both transabdominal and transvaginal ultrasound examinations were performed for complete evaluation of the gestation as well as the maternal uterus, adnexal regions, and pelvic cul-de-sac. Transvaginal technique was performed to assess early pregnancy. COMPARISON:  None. FINDINGS: An intrauterine pregnancy is identified. The fetal heart rate is 175 beats per minute. The crown-rump length is 4.3 cm correlating with a gestational age of [redacted] weeks and 2 days. A large fibroid is seen at the fundus of the uterus measuring 1.4 x 8.9 x 1.3 cm. No fluid in the cul-de-sac. No subchorionic hemorrhage identified. Both ovaries are somewhat enlarged with the right measuring 5.5 x 1.9 x 2.7 cm and the left measuring 3.9 x 1.9 x 5.1 cm. Both ovaries contain multiple small follicles. A rounded collection is seen adjacent to the lumbar region of the fetus. This is probably the yolk sac. Recommend attention on follow-up. No other abnormalities are identified. IMPRESSION: No acute  abnormalities identified. Single live IUP as described above. Electronically Signed   By: Dorise Bullion III M.D   On: 11/22/2015 14:11   US Ob Transvaginal  11/22/2015  CLINICAL DATA:  Abdominal pain. EXAM: OBSTETRIC <14 WK [place] AND TRANSVAGINAL OB US TECHNIQUE: Both transabdominal and transvaginal ultrasound examinations were performed for complete evaluation of the gestation as well as the maternal uterus, adnexal regions, and pelvic cul-de-sac. Transvaginal technique was performed to assess early pregnancy. COMPARISON:  None. FINDINGS: An intrauterine pregnancy is identified. The fetal heart rate is 175 beats per minute. The crown-rump length is 4.3 cm correlating with a gestational age of [redacted] weeks and 2 days. A large fibroid is seen at the fundus of the uterus measuring 1.4 x 8.9 x 1.3 cm. No fluid in the cul-de-sac. No subchorionic hemorrhage identified. Both ovaries are somewhat enlarged with the right measuring 5.5 x 1.9 x 2.7 cm and the left measuring 3.9 x 1.9 x 5.1 cm. Both ovaries contain multiple small follicles. A rounded collection is seen adjacent to the lumbar  region of the fetus. This is probably the yolk sac. Recommend attention on follow-up. No other abnormalities are identified. IMPRESSION: No acute abnormalities identified. Single live IUP as described above. Electronically Signed   By: Dorise Bullion III M.D   On: 11/22/2015 14:11   I have personally reviewed and evaluated these images and lab results as part of my medical decision-making.   EKG Interpretation None      MDM   Final diagnoses:  IUP (intrauterine pregnancy), incidental  Uterine leiomyoma, unspecified location    Ultrasound shows healthy 11 weeks fetus with 9 cm fibroid. Patient told to take Tylenol for pain and follow-up with the [facility]    Milton Ferguson, MD 11/22/15 [phone]

## 2015-11-22 NOTE — ED Notes (Signed)
MD at bedside. 

## 2017-04-30 IMAGING — US US PELVIS COMPLETE
1 series · 14 of 25 positions shown · non-contrast
Comparison: None

CLINICAL DATA: Acute bilateral pelvic pain.



[Series 1: us pelvis complete · 0.22mm/px · 14 of 113 slices shown]
[im 1/113]
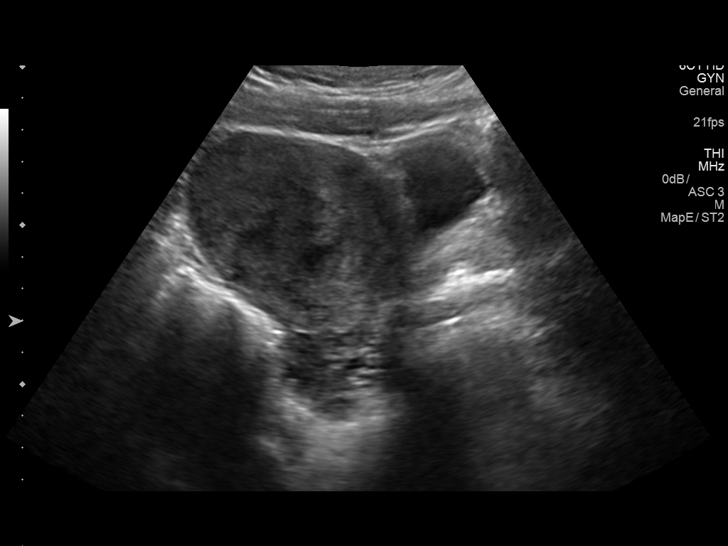
[im 10/113]
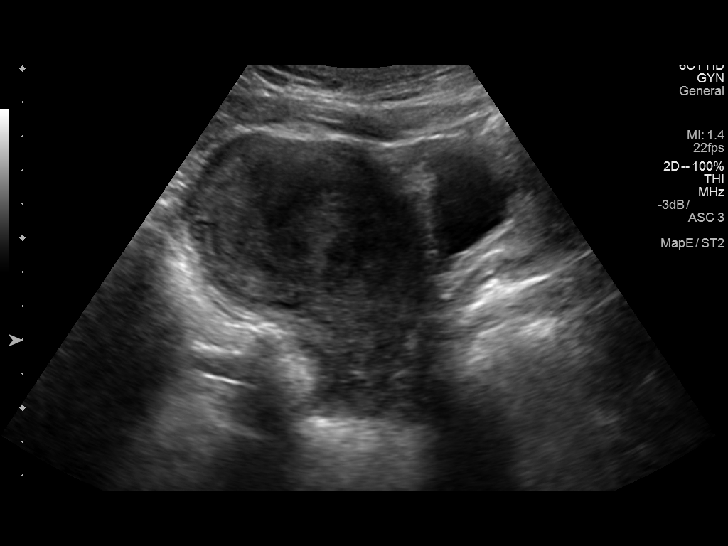
[im 19/113]
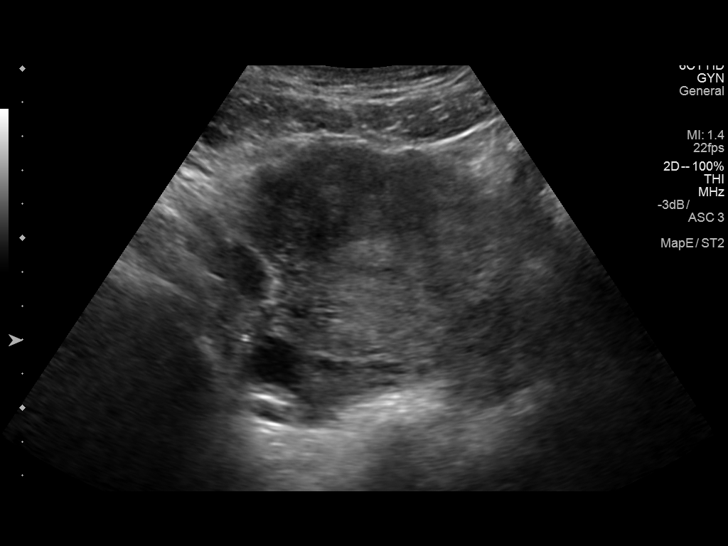
[im 29/113]
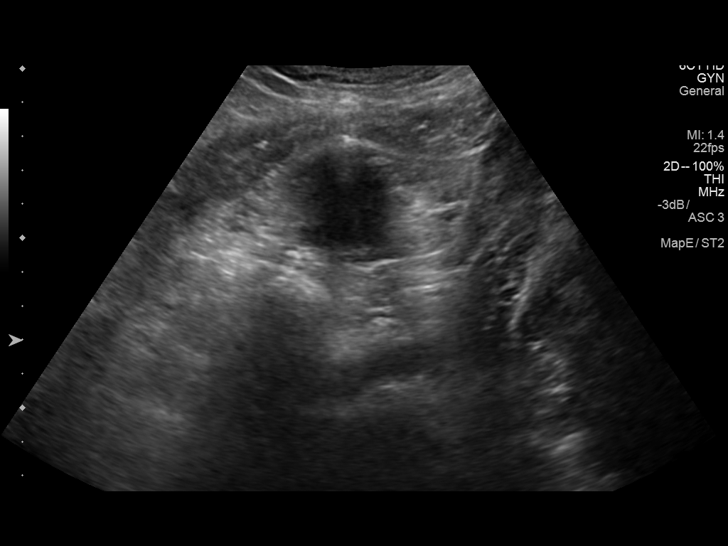
[im 38/113]
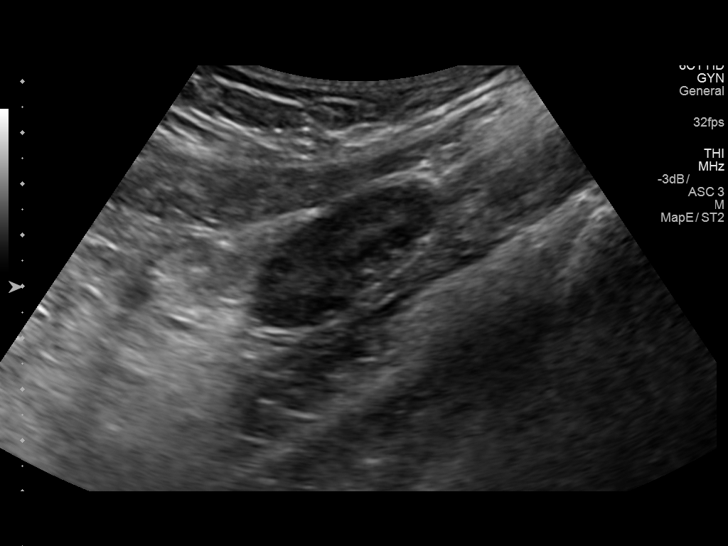
[im 43/113]
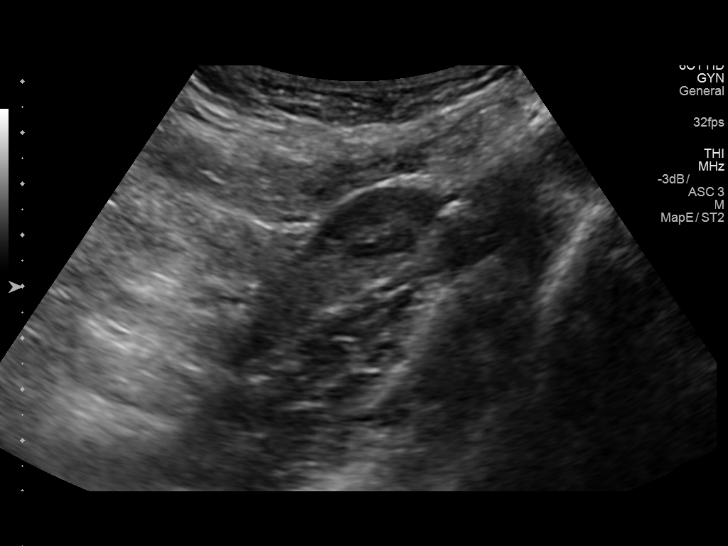
[im 52/113]
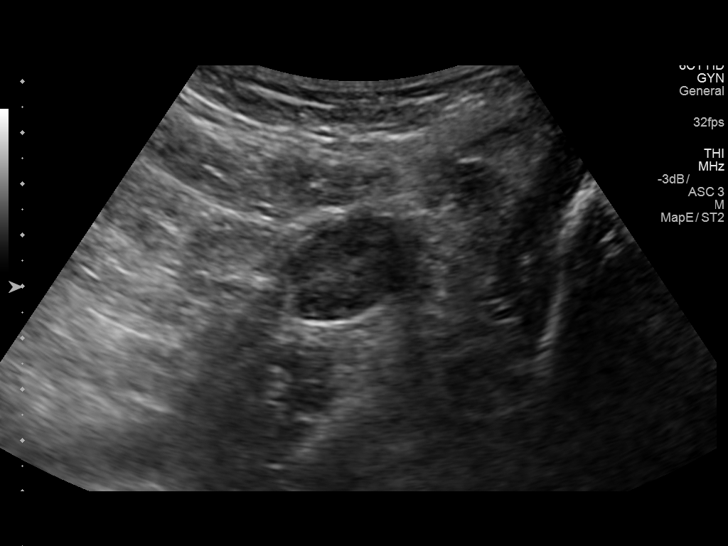
[im 61/113]
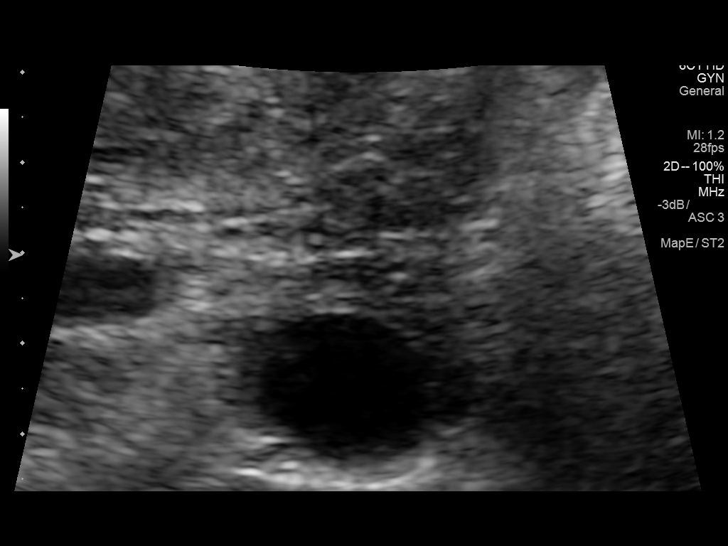
[im 71/113]
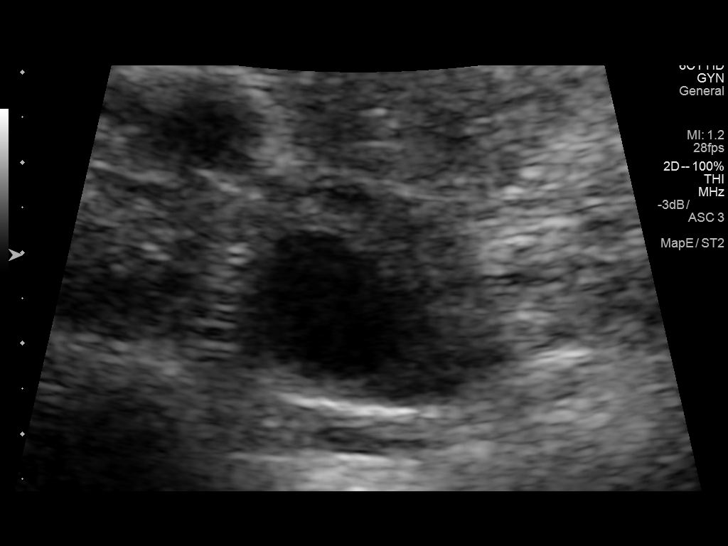
[im 75/113]
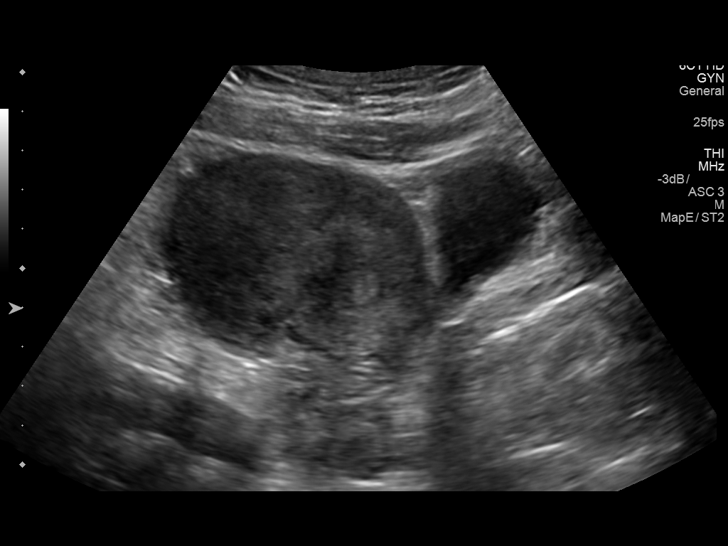
[im 85/113]
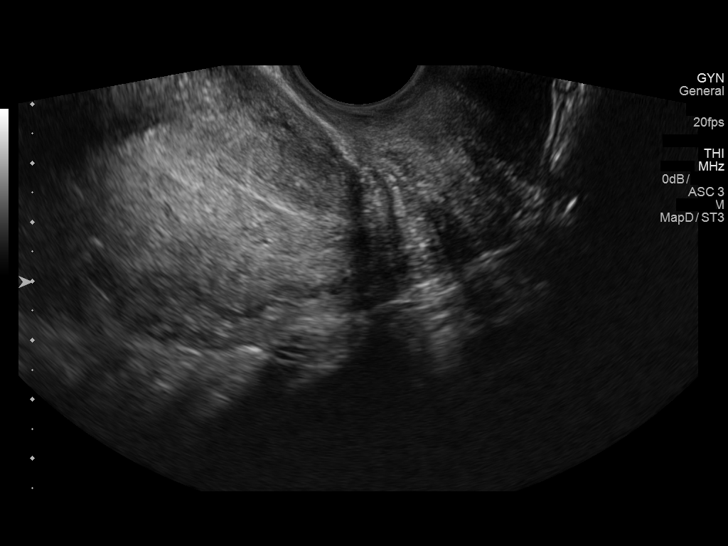
[im 94/113]
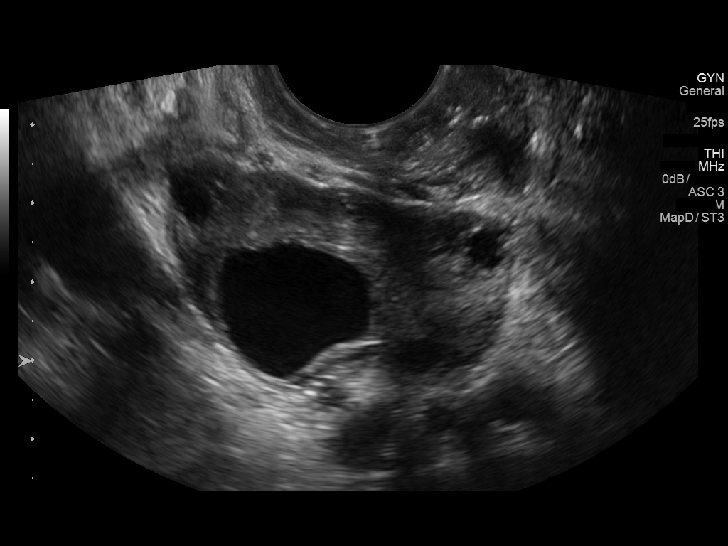
[im 103/113]
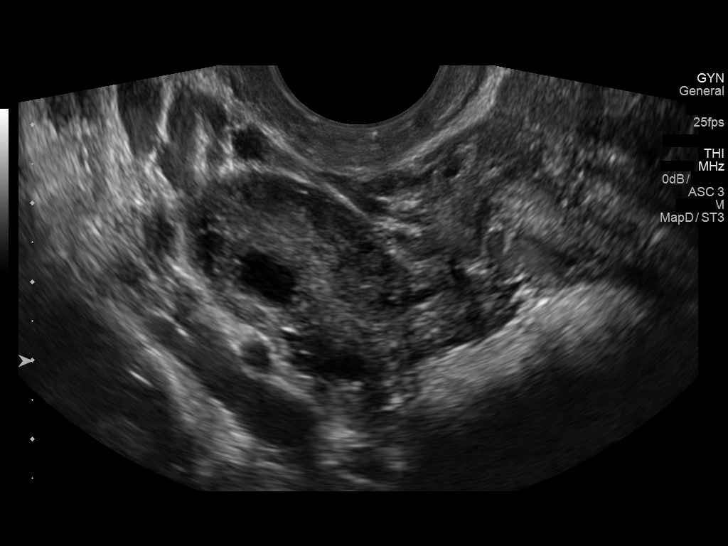
[im 113/113]
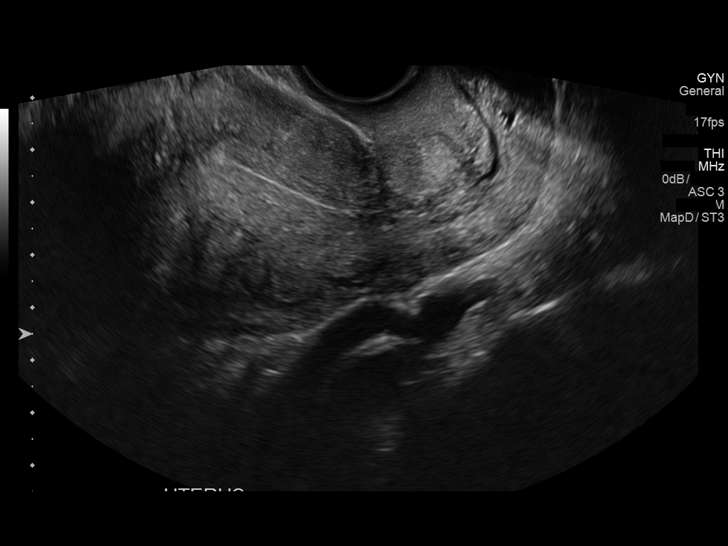

[14 of 25 positions shown; findings below may reference images not displayed]

FINDINGS: Uterus

Measurements: 8.7 x 7.8 x 6.4 cm. Fibroid measuring 4.8 x 5.5 x
cm is noted on right side.

Endometrium

Thickness: 8 mm which is within normal limits. No focal abnormality
visualized.

Right ovary

Measurements: 4.2 x 3.1 x 2.3 cm. Multiple follicular cysts are
noted, with the largest measuring 2.1 cm.

Left ovary

Measurements: 4.1 x 3.1 x 1.7 cm. Normal appearance/no adnexal mass.

Other findings

No abnormal free fluid.
IMPRESSION: 5.5 cm uterine fibroid is noted. No other significant abnormality
seen in the pelvis.

## 2017-06-02 IMAGING — CR DG CHEST 2V
2 series · 2 of 2 positions shown · non-contrast
Comparison: None.

CLINICAL DATA: Cough and congestion for 4 days

EXAM:
CHEST - 2 VIEW

[w chest pa]
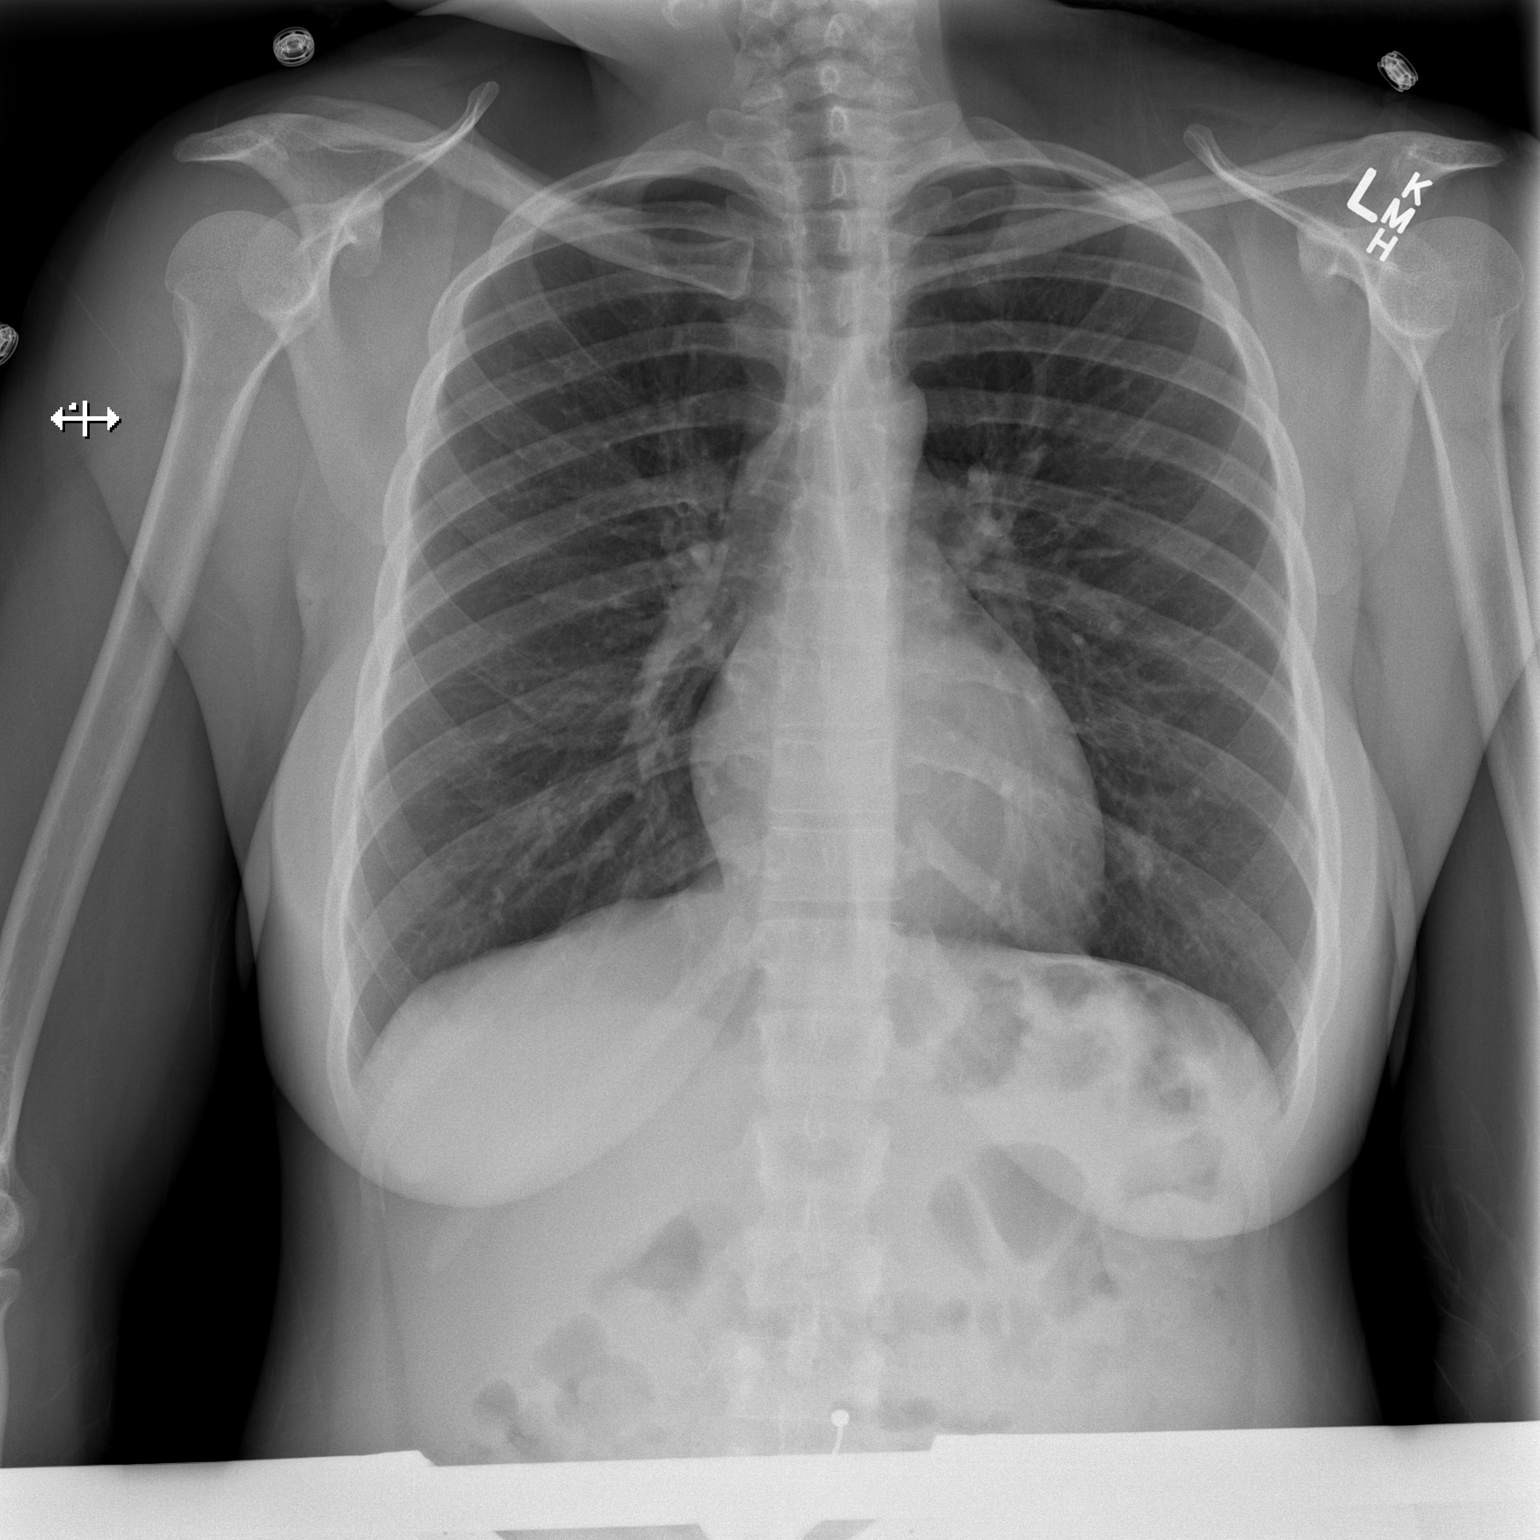

[w chest lat]
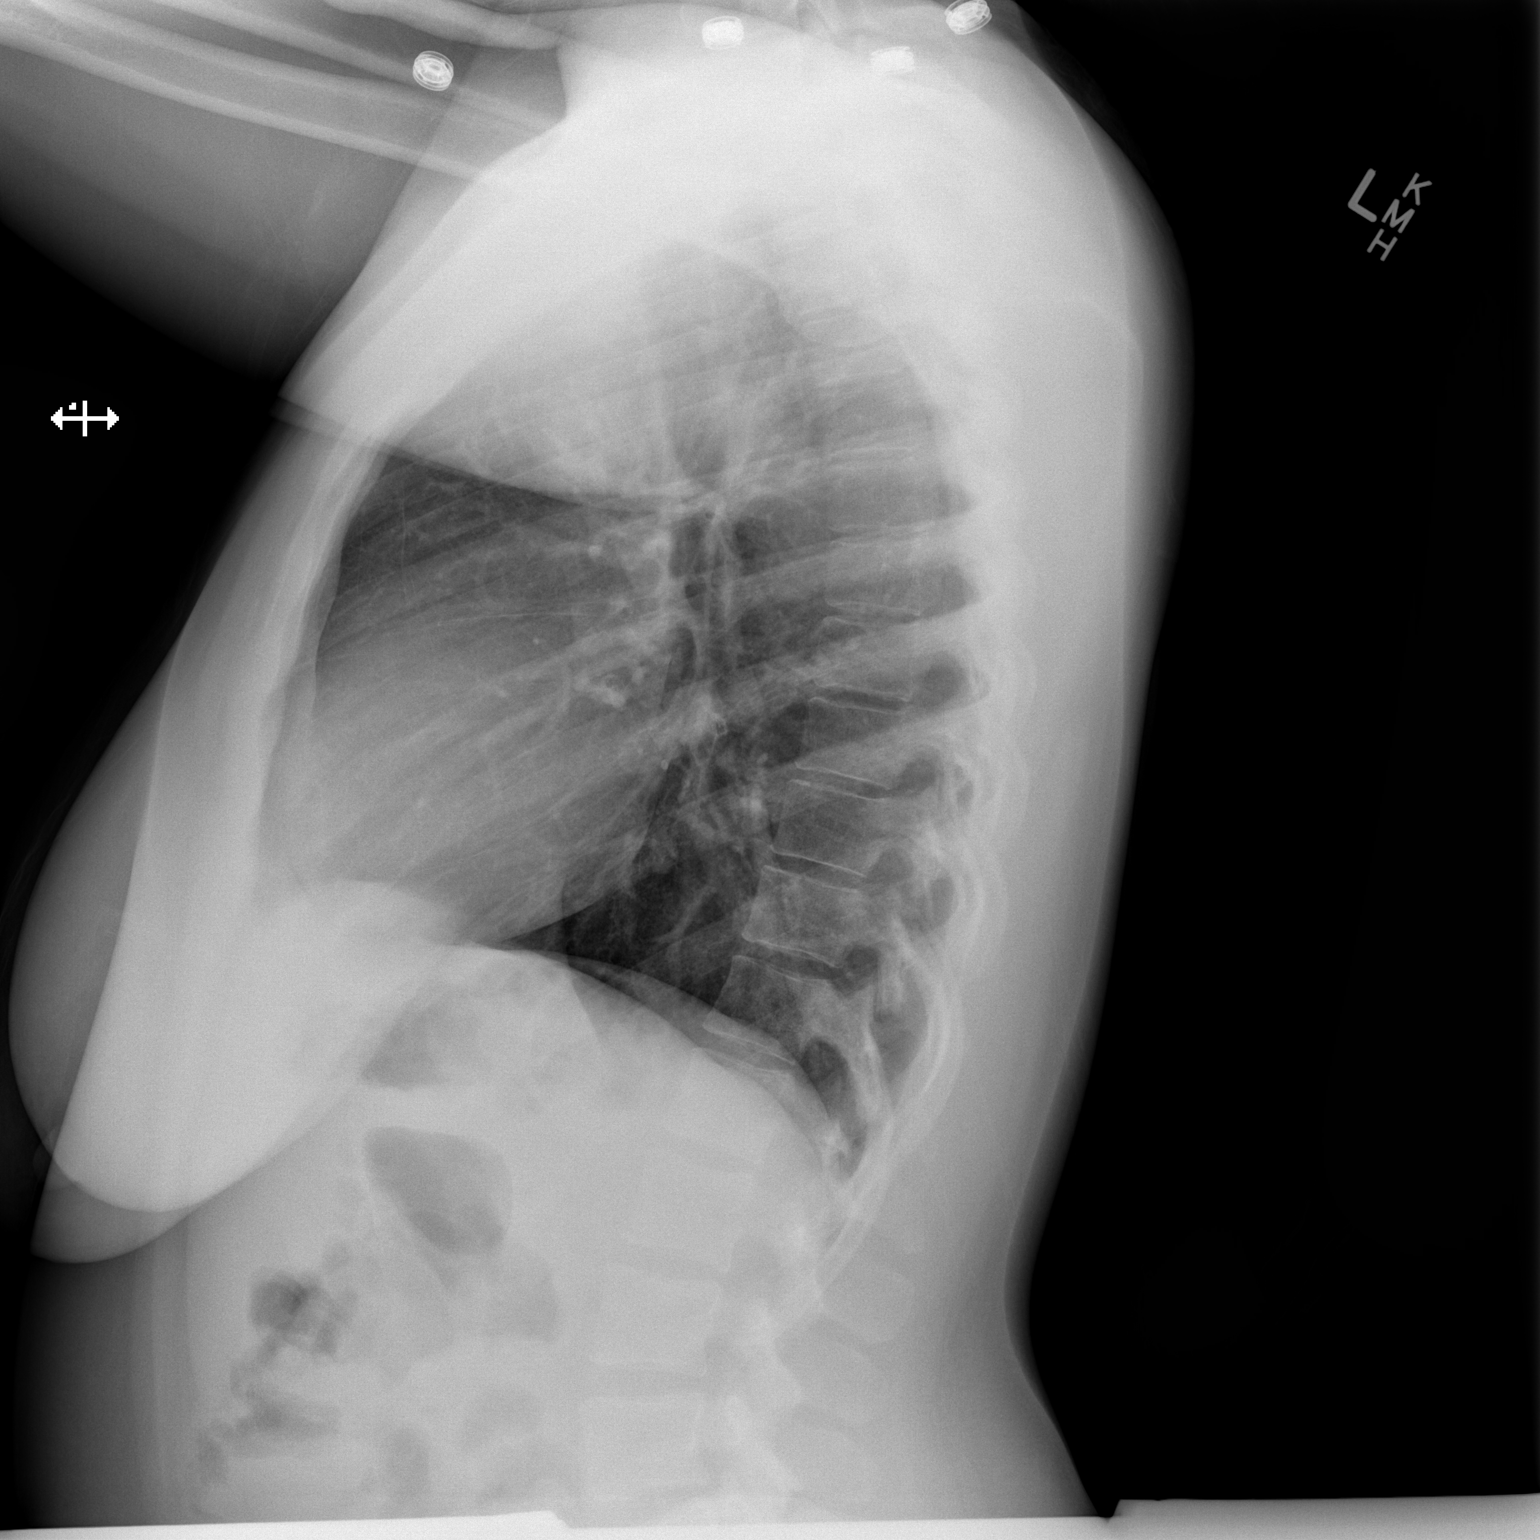

[2 of 2 positions shown; findings below may reference images not displayed]

FINDINGS: The heart size and mediastinal contours are within normal limits.
Both lungs are clear. The visualized skeletal structures are
unremarkable.
IMPRESSION: No active disease.

## 2017-08-29 IMAGING — US US OB TRANSVAGINAL
1 series · 13 of 28 positions shown · non-contrast
Comparison: None.

ADDENDUM:
Upon further review, the rounded fluid collection noted posteriorly,
adjacent to the lumbar region, throughout the study would be an
unusual location for a yolk sac. The findings are concerning for an
neural tube defect, possibly a meningocele. This would not change
the patient's management today as it would not be associated with
acute symptoms. However, it is important for the patient follow-up,
probably with high risk OB, for additional imaging and labs.

These results and recommendations were called to Olguere Olfe, the
flow nurse in the emergency room. She will contact the patient to
notify her of the additional findings and the necessity to followup
for additional imaging and labs.
CLINICAL DATA: Abdominal pain.
EXAM:
OBSTETRIC <14 WK US AND TRANSVAGINAL OB US
TECHNIQUE: Both transabdominal and transvaginal ultrasound examinations were
performed for complete evaluation of the gestation as well as the
maternal uterus, adnexal regions, and pelvic cul-de-sac.
Transvaginal technique was performed to assess early pregnancy.

[Series 1: us ob transvaginal · 0.23mm/px · 13 of 61 slices shown]
[im 3/61]
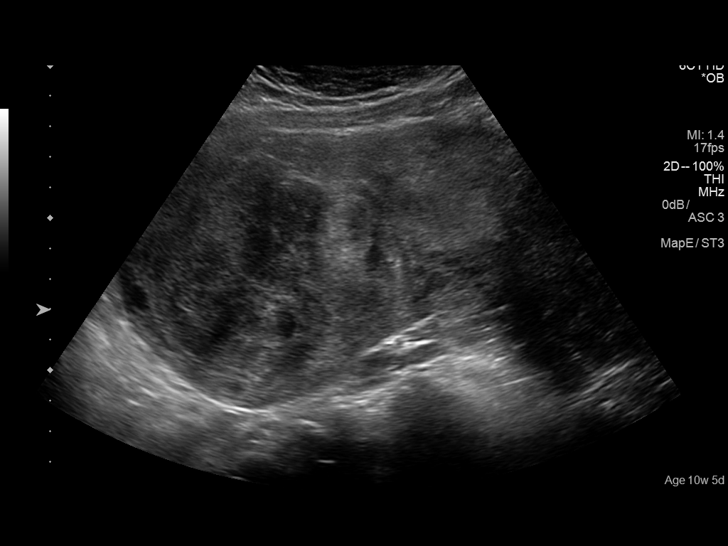
[im 7/61]
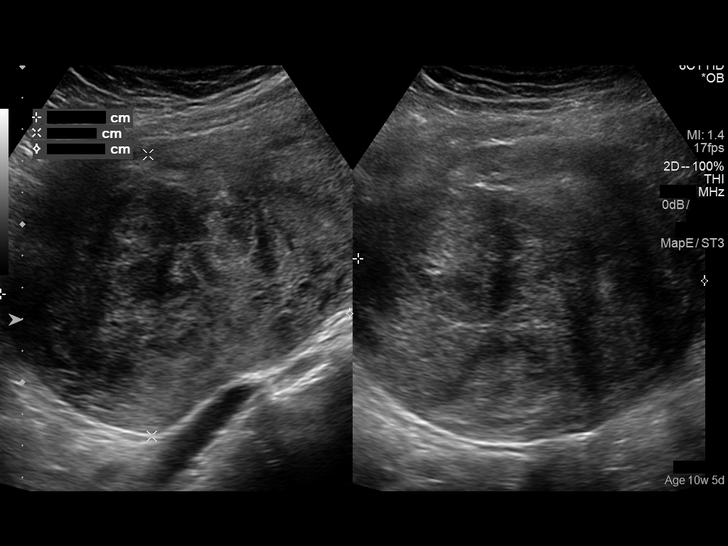
[im 12/61]
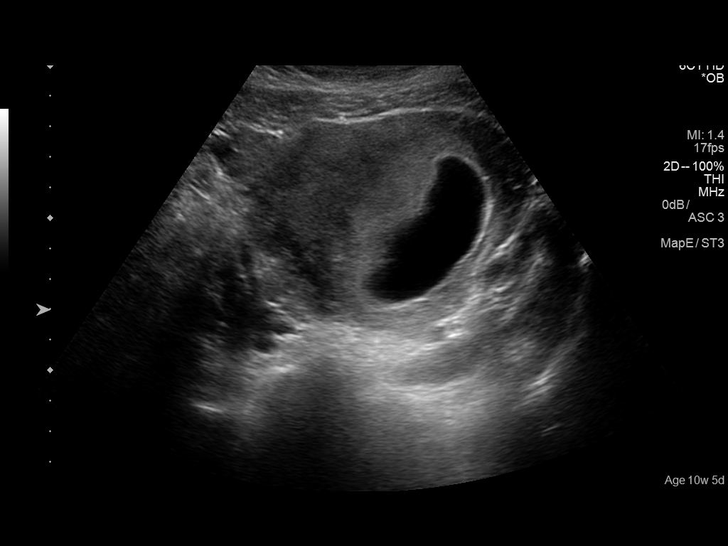
[im 16/61]
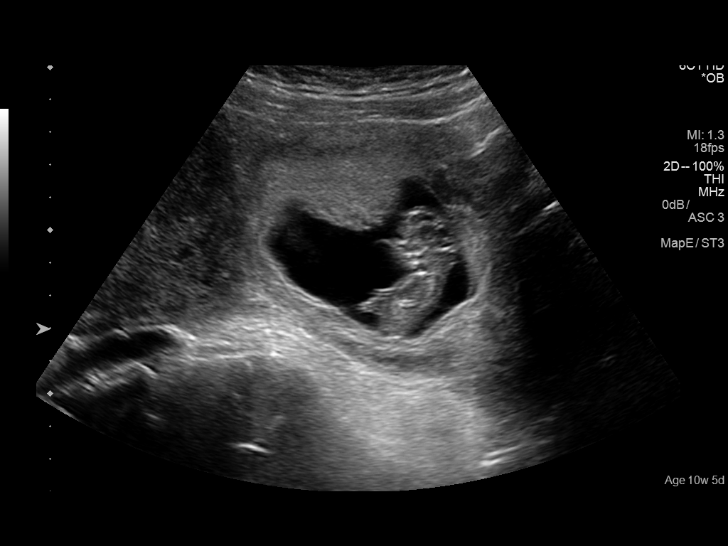
[im 21/61]
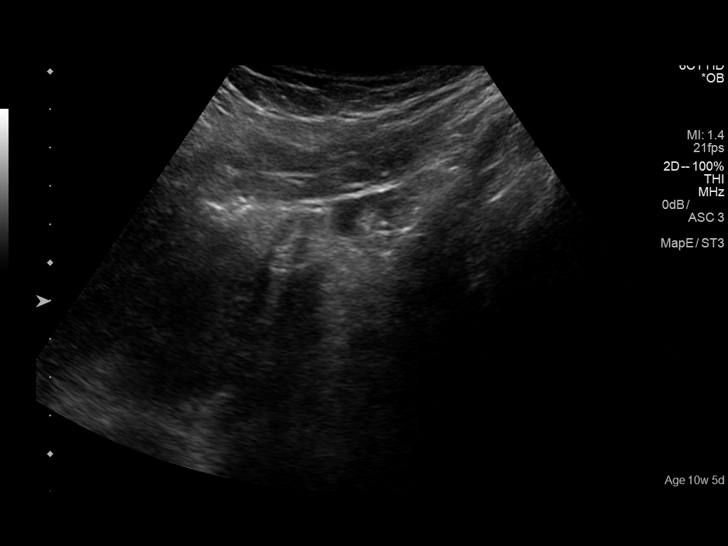
[im 25/61]
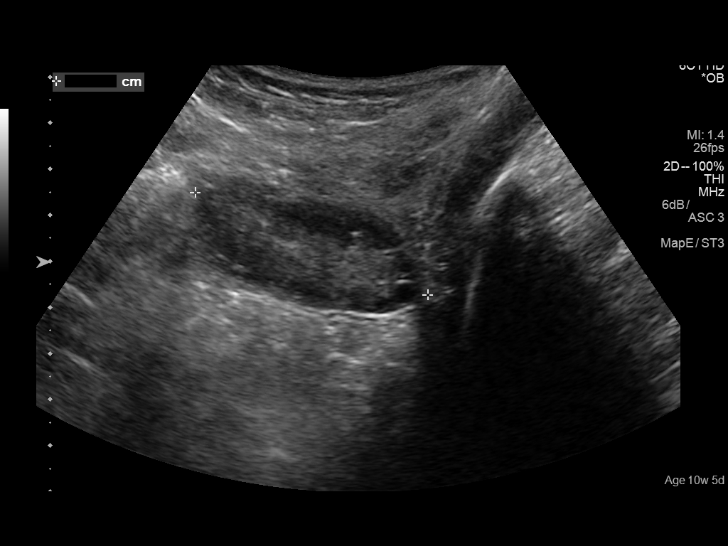
[im 32/61]
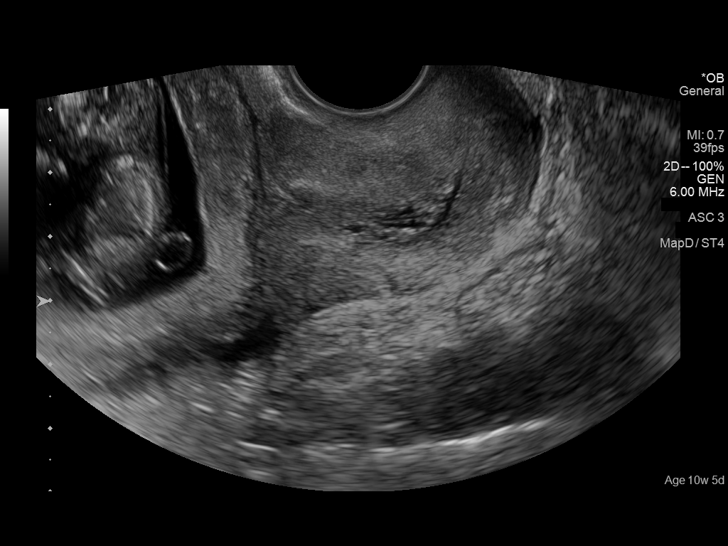
[im 36/61]
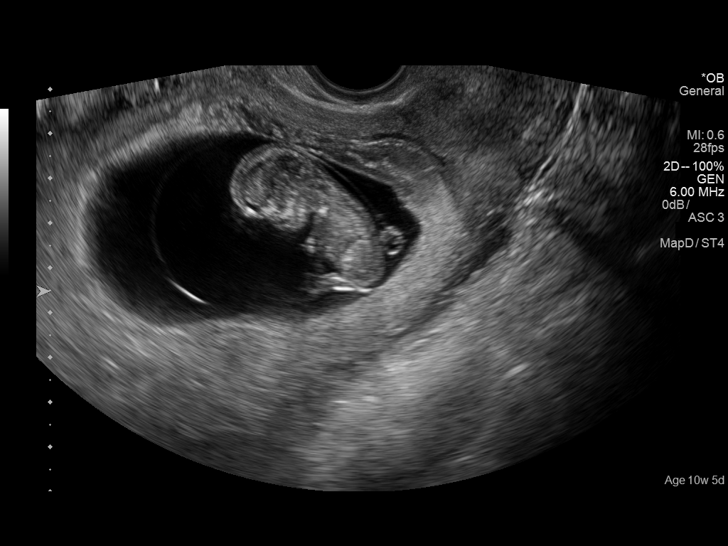
[im 41/61]
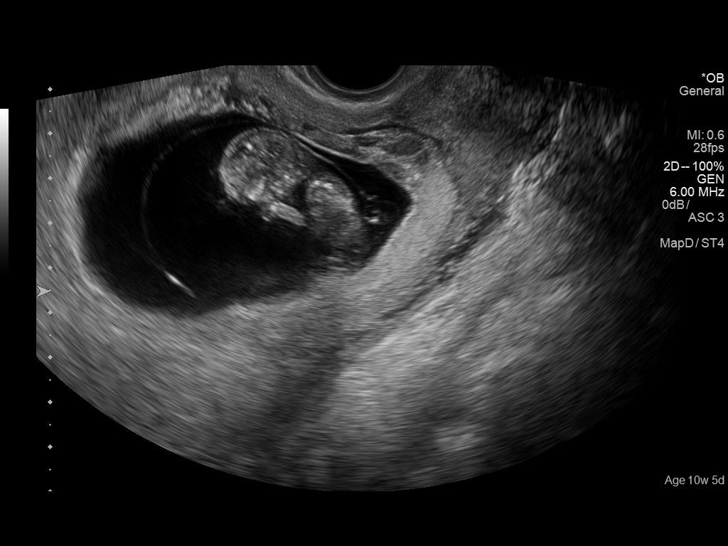
[im 45/61]
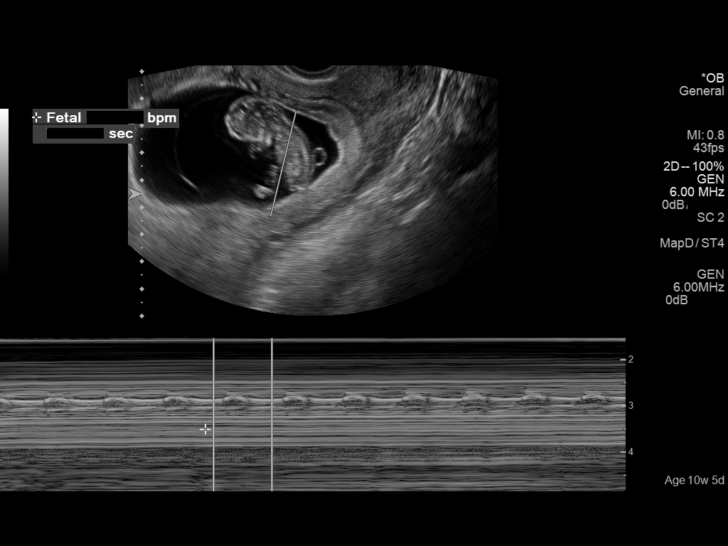
[im 49/61]
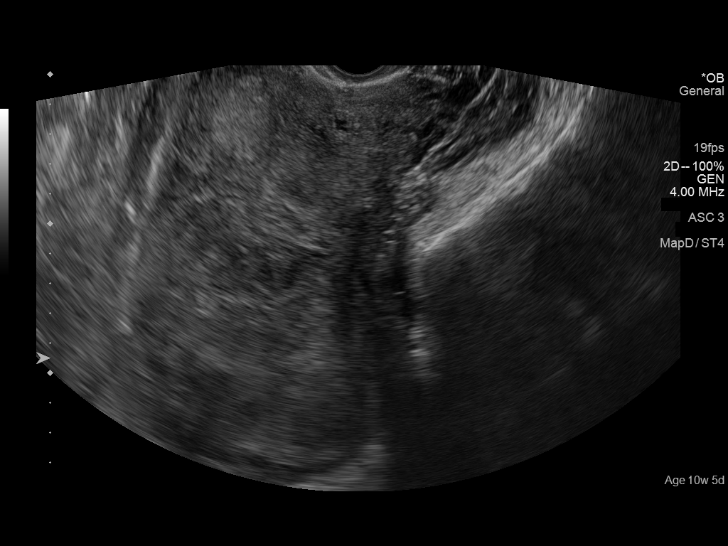
[im 54/61]
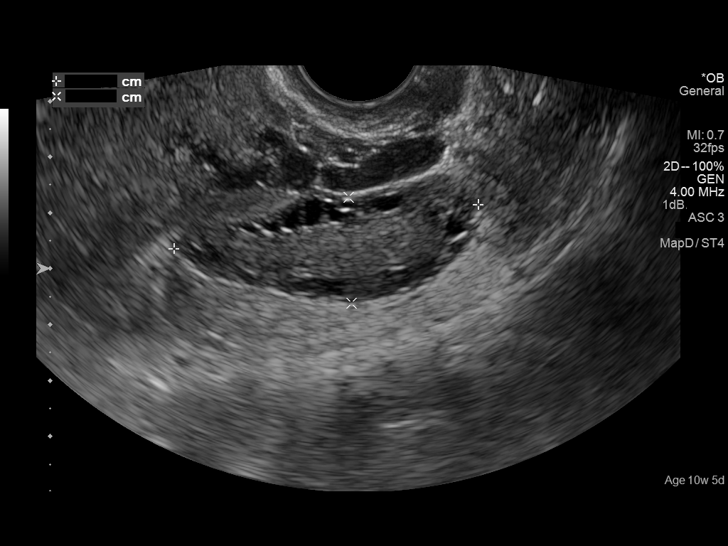
[im 58/61]
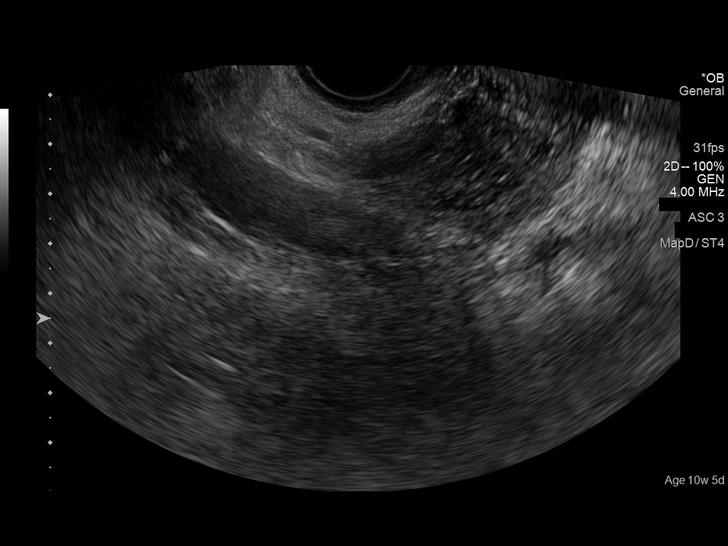

[13 of 28 positions shown; findings below may reference images not displayed]

FINDINGS: An intrauterine pregnancy is identified. The fetal heart rate is 175
beats per minute. The crown-rump length is 4.3 cm correlating with a
gestational age of 11 weeks and 2 days. A large fibroid is seen at
the fundus of the uterus measuring 1.4 x 8.9 x 1.3 cm. No fluid in
the cul-de-sac. No subchorionic hemorrhage identified. Both ovaries
are somewhat enlarged with the right measuring 5.5 x 1.9 x 2.7 cm
and the left measuring 3.9 x 1.9 x 5.1 cm. Both ovaries contain
multiple small follicles. A rounded collection is seen adjacent to
the lumbar region of the fetus. This is probably the yolk sac.
Recommend attention on follow-up. No other abnormalities are
identified.
IMPRESSION: No acute abnormalities identified. Single live IUP as described
above.
